# Patient Record
Sex: Female | Born: 1975 | Smoking: Current every day smoker
Health system: Southern US, Community
[De-identification: ages and names within clinical notes are randomized; demographics above are authoritative.]

## PROBLEM LIST (undated history)

## (undated) DIAGNOSIS — M797 Fibromyalgia: Secondary | ICD-10-CM

## (undated) DIAGNOSIS — F4310 Post-traumatic stress disorder, unspecified: Secondary | ICD-10-CM

## (undated) DIAGNOSIS — M329 Systemic lupus erythematosus, unspecified: Secondary | ICD-10-CM

## (undated) DIAGNOSIS — IMO0002 Reserved for concepts with insufficient information to code with codable children: Secondary | ICD-10-CM

## (undated) DIAGNOSIS — F419 Anxiety disorder, unspecified: Secondary | ICD-10-CM

## (undated) DIAGNOSIS — F410 Panic disorder [episodic paroxysmal anxiety] without agoraphobia: Secondary | ICD-10-CM

## (undated) HISTORY — DX: Fibromyalgia: M79.7

## (undated) HISTORY — DX: Anxiety disorder, unspecified: F41.9

## (undated) HISTORY — DX: Panic disorder (episodic paroxysmal anxiety): F41.0

## (undated) HISTORY — DX: Post-traumatic stress disorder, unspecified: F43.10

## (undated) HISTORY — DX: Reserved for concepts with insufficient information to code with codable children: IMO0002

## (undated) HISTORY — DX: Systemic lupus erythematosus, unspecified: M32.9

---

## 2007-05-17 IMAGING — US US PELVIS COMPLETE MODIFY
1 series · 18 of 25 positions shown · non-contrast
Comparison: none

CLINICAL DATA: Status post spontaneous abortion on [DATE] with heavy bleeding and pain.
 TRANSABDOMINAL AND ENDOVAGINAL PELVIC ULTRASOUND:
 Multiple images of the uterus and adnexa were obtained using a transabdominal and endovaginal approaches. 
 The uterus has a sagittal length of 7.2 cm, AP width of 3.0 cm and a transverse width of 5.0 cm.  A homogeneous uterine myometrium is seen.  The endometrial stripe appears thin and echogenic with an AP width of .34 cm.  No focal thickening or inhomogeniety is identified.  No sonographic stigmata suggestive of retained products of conception are noted.  
 Both ovaries are seen with the left ovary measuring 2.0 x 1.2 x 1.6 cm and the right ovary measuring 1.5 x 1.3 x 1.6 cm.  No cul-de-sac or periovarian fluid is seen and no separate adnexal masses are noted.

[Series 1: us pelvis complete · 18 of 58 slices shown]
[im 1/58]
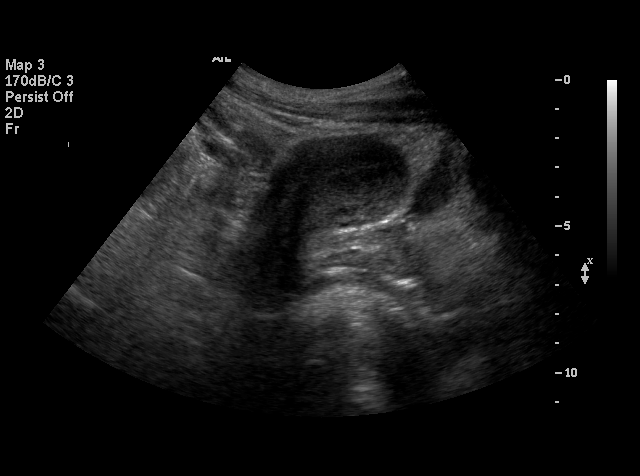
[im 5/58]
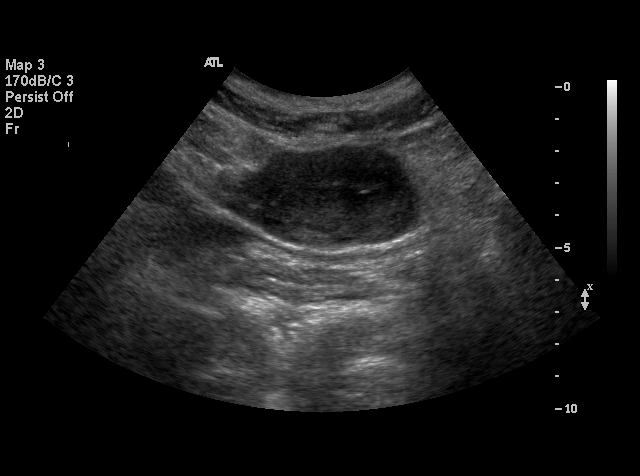
[im 8/58]
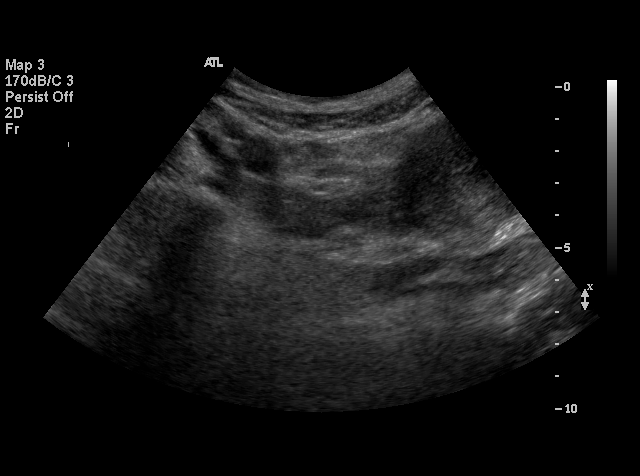
[im 10/58]
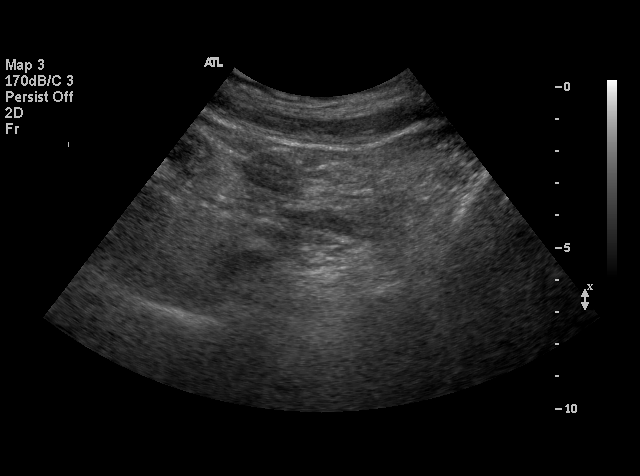
[im 15/58]
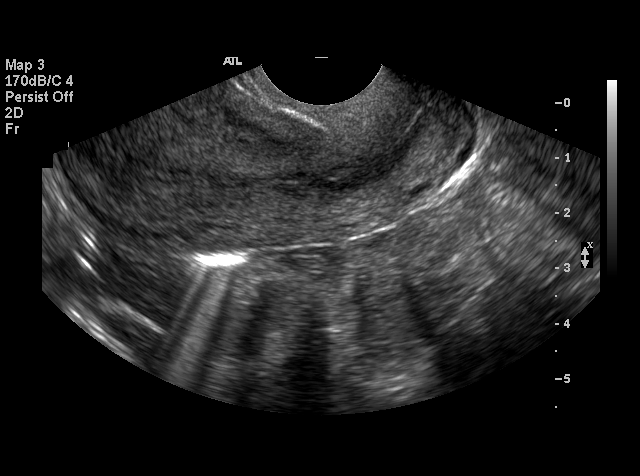
[im 17/58]
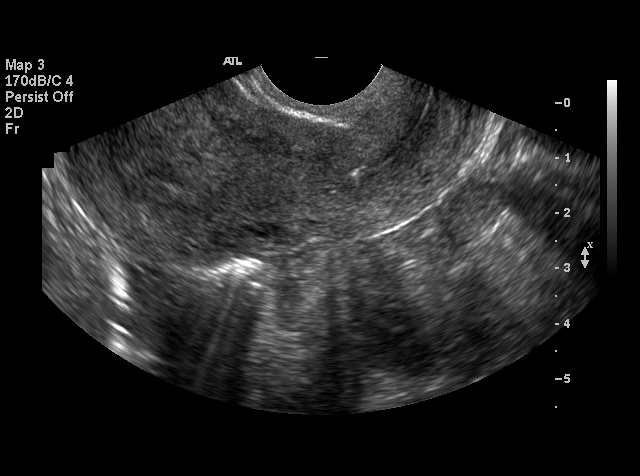
[im 22/58]
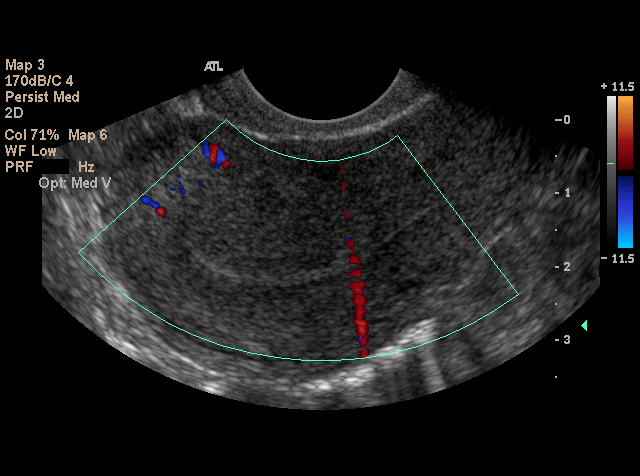
[im 24/58]
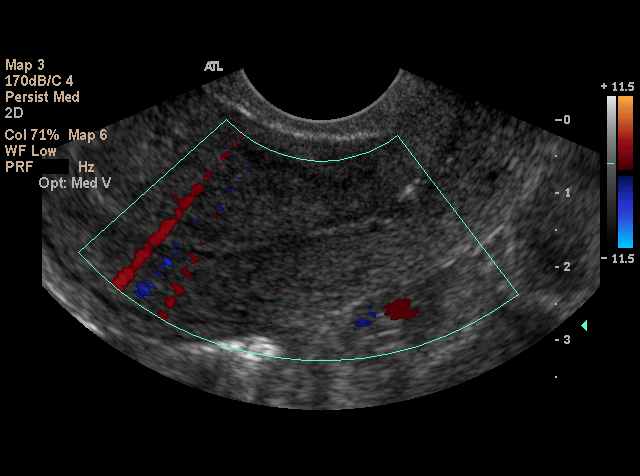
[im 27/58]
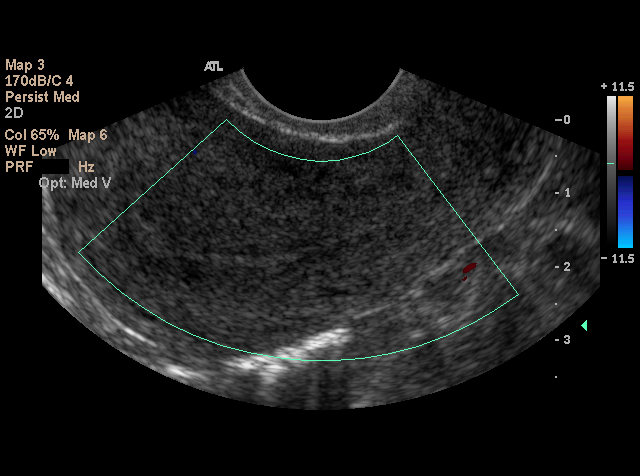
[im 31/58]
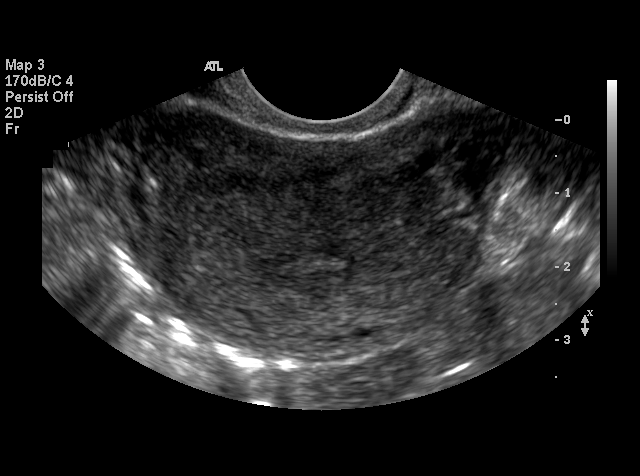
[im 34/58]
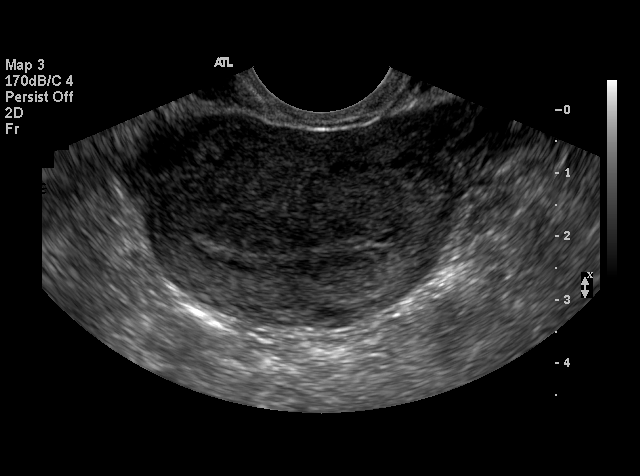
[im 36/58]
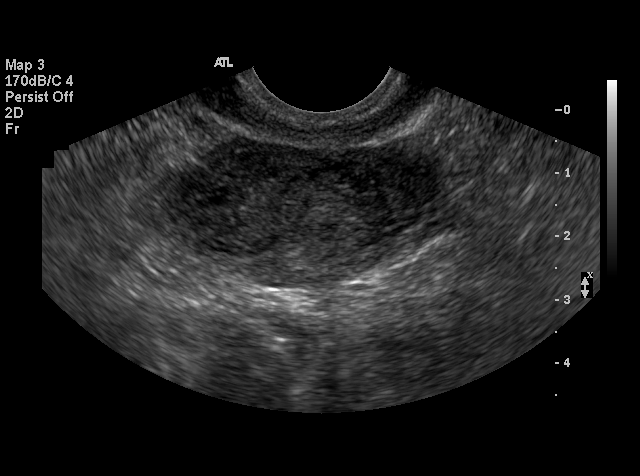
[im 41/58]
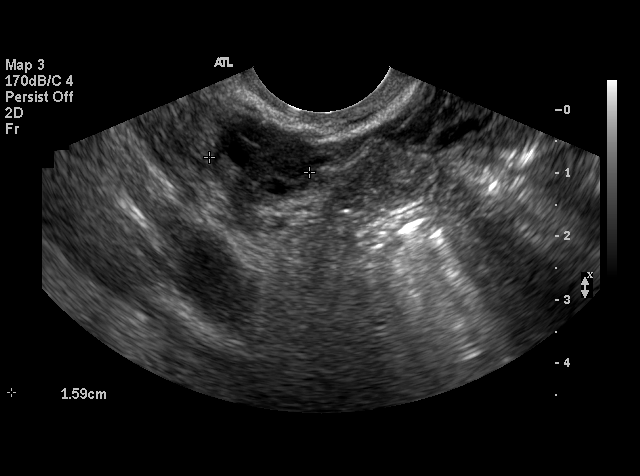
[im 43/58]
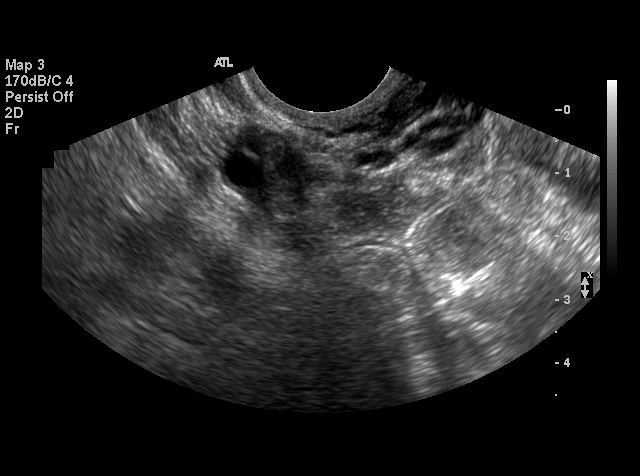
[im 48/58]
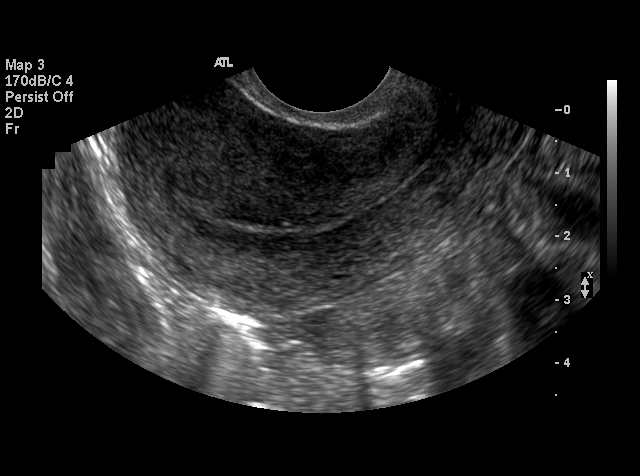
[im 50/58]
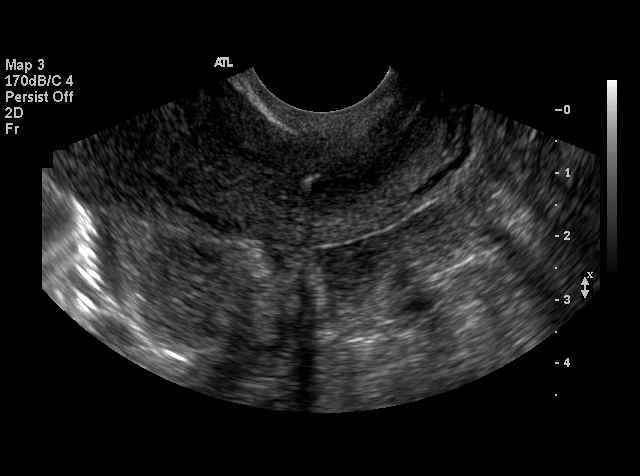
[im 53/58]
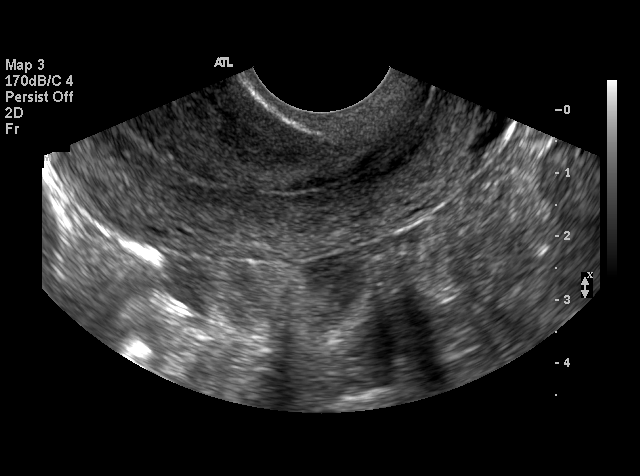
[im 58/58]
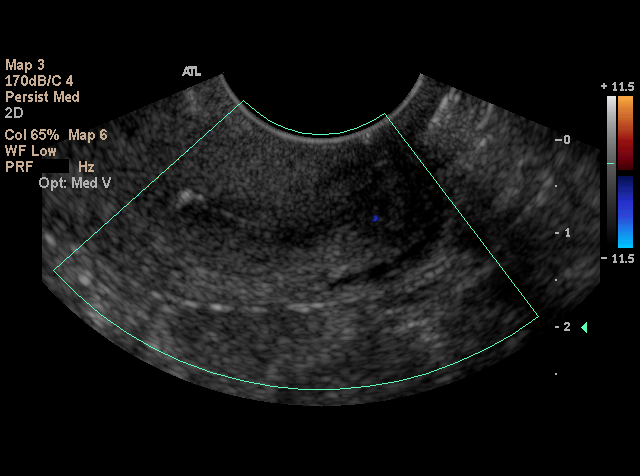

[18 of 25 positions shown; findings below may reference images not displayed]

IMPRESSION: No sonographic evidence for retained products of conception.  Normal pelvic ultrasound.

## 2016-07-21 DIAGNOSIS — M2390 Unspecified internal derangement of unspecified knee: Secondary | ICD-10-CM | POA: Insufficient documentation

## 2016-08-23 DIAGNOSIS — F4310 Post-traumatic stress disorder, unspecified: Secondary | ICD-10-CM | POA: Insufficient documentation

## 2016-08-23 DIAGNOSIS — L930 Discoid lupus erythematosus: Secondary | ICD-10-CM | POA: Insufficient documentation

## 2016-08-23 DIAGNOSIS — F410 Panic disorder [episodic paroxysmal anxiety] without agoraphobia: Secondary | ICD-10-CM | POA: Insufficient documentation

## 2016-10-15 ENCOUNTER — Ambulatory Visit (INDEPENDENT_AMBULATORY_CARE_PROVIDER_SITE_OTHER): Payer: No Typology Code available for payment source | Admitting: Psychiatry

## 2016-10-15 ENCOUNTER — Encounter: Payer: Self-pay | Admitting: Psychiatry

## 2016-10-15 ENCOUNTER — Other Ambulatory Visit: Payer: Self-pay

## 2016-10-15 VITALS — Ht 63.0 in | Wt 222.0 lb

## 2016-10-15 DIAGNOSIS — F410 Panic disorder [episodic paroxysmal anxiety] without agoraphobia: Secondary | ICD-10-CM

## 2016-10-15 DIAGNOSIS — F4310 Post-traumatic stress disorder, unspecified: Secondary | ICD-10-CM

## 2016-10-15 DIAGNOSIS — F411 Generalized anxiety disorder: Secondary | ICD-10-CM

## 2016-10-15 MED ORDER — CLONAZEPAM 0.5 MG PO TABS
0.5000 mg | ORAL_TABLET | Freq: Two times a day (BID) | ORAL | 0 refills | Status: DC | PRN
Start: 2016-10-15 — End: 2016-10-15

## 2016-10-15 MED ORDER — HYDROXYZINE HCL 25 MG PO TABS
ORAL_TABLET | ORAL | 0 refills | Status: DC
Start: 2016-10-15 — End: 2016-11-11

## 2016-10-15 MED ORDER — CLONAZEPAM 0.5 MG PO TABS
0.5000 mg | ORAL_TABLET | Freq: Three times a day (TID) | ORAL | 0 refills | Status: DC | PRN
Start: 2016-10-15 — End: 2016-11-11

## 2016-10-15 MED ORDER — MIRTAZAPINE 15 MG PO TABS
ORAL_TABLET | ORAL | 1 refills | Status: DC
Start: 2016-10-15 — End: 2016-11-11

## 2016-10-15 MED ORDER — HYDROXYZINE HCL 25 MG PO TABS
ORAL_TABLET | ORAL | 0 refills | Status: DC
Start: 2016-10-15 — End: 2016-10-15
  Filled 2016-10-15: qty 90, fill #0
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
## 2016-11-10 ENCOUNTER — Other Ambulatory Visit: Payer: Self-pay | Admitting: Psychiatry

## 2016-11-10 ENCOUNTER — Encounter: Payer: Self-pay | Admitting: Psychiatry

## 2016-11-11 ENCOUNTER — Other Ambulatory Visit: Payer: Self-pay | Admitting: Psychiatry

## 2016-11-11 MED ORDER — HYDROXYZINE HCL 25 MG PO TABS
ORAL_TABLET | ORAL | 0 refills | Status: DC
Start: 2016-11-11 — End: 2016-11-25

## 2016-11-11 MED ORDER — CLONAZEPAM 0.5 MG PO TABS
0.5000 mg | ORAL_TABLET | Freq: Two times a day (BID) | ORAL | 0 refills | Status: DC | PRN
Start: 2016-11-11 — End: 2016-11-25

## 2016-11-11 MED ORDER — MIRTAZAPINE 15 MG PO TABS
ORAL_TABLET | ORAL | 0 refills | Status: DC
Start: 2016-11-11 — End: 2016-11-25

## 2016-11-25 ENCOUNTER — Other Ambulatory Visit: Payer: Self-pay | Admitting: Psychiatry

## 2016-11-25 ENCOUNTER — Encounter: Payer: Self-pay | Admitting: Psychiatry

## 2016-11-25 MED ORDER — HYDROXYZINE HCL 25 MG PO TABS
ORAL_TABLET | ORAL | 1 refills | Status: AC
Start: 2016-11-25 — End: 2017-11-25

## 2016-11-25 MED ORDER — CLONAZEPAM 0.5 MG PO TABS
ORAL_TABLET | ORAL | 0 refills | Status: AC
Start: 2016-11-25 — End: 2016-12-25

## 2016-11-25 MED ORDER — MIRTAZAPINE 15 MG PO TABS
ORAL_TABLET | ORAL | 1 refills | Status: AC
Start: 2016-11-25 — End: 2017-11-25

## 2016-11-26 ENCOUNTER — Ambulatory Visit: Payer: Medicaid Other | Admitting: Psychiatry
